# Patient Record
Sex: Male | Born: 1995 | Race: White | Hispanic: No | Marital: Single | Smoking: Current every day smoker
Health system: Southern US, Community
[De-identification: ages and names within clinical notes are randomized; demographics above are authoritative.]

---
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
## 2020-02-07 ENCOUNTER — Encounter (HOSPITAL_COMMUNITY): Payer: Self-pay

## 2020-02-07 ENCOUNTER — Other Ambulatory Visit: Payer: Self-pay

## 2020-02-07 ENCOUNTER — Ambulatory Visit (HOSPITAL_COMMUNITY)
Admission: EM | Admit: 2020-02-07 | Discharge: 2020-02-07 | Disposition: A | Discharge status: Home / Self Care | Payer: 59 | Attending: Urgent Care | Admitting: Urgent Care

## 2020-02-07 DIAGNOSIS — R21 Rash and other nonspecific skin eruption: Secondary | ICD-10-CM | POA: Diagnosis not present

## 2020-02-07 DIAGNOSIS — J029 Acute pharyngitis, unspecified: Secondary | ICD-10-CM | POA: Diagnosis not present

## 2020-02-07 LAB — POCT RAPID STREP A: Streptococcus, Group A Screen (Direct): NEGATIVE

## 2020-02-07 MED ORDER — KETOCONAZOLE 2 % EX CREA: 1.0000 "application " | TOPICAL_CREAM | Freq: Every day | CUTANEOUS | 0 refills | Status: AC

## 2020-02-07 MED ORDER — AZITHROMYCIN 250 MG PO TABS: ORAL_TABLET | ORAL | 0 refills | Status: AC

## 2020-02-07 NOTE — ED Provider Notes (Signed)
[place]   MRN: [id number] DOB: Aug 02, 1996  Subjective:   Scott Bates is a 24 y.o. male presenting for 3 day hx of acute onset worsening moderate persistent throat pain with difficulty swallowing.  The next day, patient got a Covid vaccine and ended up developing a rash over his right elbow.  He states the rash does not really bother him apart from the appearance.  Denies fever, sinus pain, sinus congestion, cough, chest pain, shortness of breath.  Denies history of allergies.  Denies taking chronic medications.  Allergies  Allergen Reactions  . Penicillins Other (See Comments)    Pt cannot recall    History reviewed. No pertinent past medical history.   History reviewed. No pertinent surgical history.  Family History  Problem Relation Age of Onset  . Healthy Mother   . Healthy Father     Social History   Tobacco Use  . Smoking status: Current Every Day Smoker  . Smokeless tobacco: Never Used  Vaping Use  . Vaping Use: Never used  Substance Use Topics  . Alcohol use: Yes  . Drug use: Yes    Types: Marijuana    ROS   Objective:   Vitals: BP 138/69   Pulse 98   Temp 97.7 F (36.5 C)   Resp 18   SpO2 100%   Physical Exam Constitutional:      General: He is not in acute distress.    Appearance: Normal appearance. He is well-developed and normal weight. He is not ill-appearing, toxic-appearing or diaphoretic.  HENT:     Head: Normocephalic and atraumatic.     Right Ear: External ear normal.     Left Ear: External ear normal.     Nose: Nose normal.     Mouth/Throat:     Pharynx: Pharyngeal swelling and oropharyngeal exudate (Bilaterally with 1+ tonsillar swelling) present.     Tonsils: Tonsillar exudate present. 1+ on the right. 1+ on the left.  Eyes:     General: No scleral icterus.       Right eye: No discharge.        Left eye: No discharge.     Extraocular Movements: Extraocular movements intact.     Pupils: Pupils are equal,  round, and reactive to light.  Cardiovascular:     Rate and Rhythm: Normal rate.  Pulmonary:     Effort: Pulmonary effort is normal.  Musculoskeletal:     Cervical back: Normal range of motion.  Skin:    General: Skin is warm and dry.     Findings: Rash (Patches of annular lesions with central clearing over extensor surface of elbow extending into proximal forearm) present.  Neurological:     Mental Status: He is alert and oriented to person, place, and time.  Psychiatric:        Mood and Affect: Mood normal.        Behavior: Behavior normal.        Thought Content: Thought content normal.        Judgment: Judgment normal.     Results for orders placed or performed during the hospital encounter of 02/07/20 (from the past 24 hour(s))  POCT rapid strep A [facility])     Status: None   Collection Time: 02/07/20  6:14 PM  Result Value Ref Range   Streptococcus, Group A Screen (Direct) NEGATIVE NEGATIVE    Assessment and Plan :   PDMP not reviewed this encounter.  1. Sore throat  2. Pharyngitis, unspecified etiology   3. Rash and nonspecific skin eruption     Given physical exam findings will cover for pharyngitis with azithromycin in light of his penicillin allergy.  Use ketoconazole to cover for fungal infection. Counseled patient on potential for adverse effects with medications prescribed/recommended today, ER and return-to-clinic precautions discussed, patient verbalized understanding.    Wallis Bamberg, [place] 02/07/20 Rickey Primus

## 2020-02-07 NOTE — ED Triage Notes (Signed)
Pt c/o sore throat x 3 days, and rash to right elbow. Pt had COVID vaccine on same arm 2 days go

## 2020-02-10 LAB — CULTURE, GROUP A STREP (THRC)

## 2020-03-02 DIAGNOSIS — Z23 Encounter for immunization: Secondary | ICD-10-CM | POA: Diagnosis not present

## 2021-08-12 ENCOUNTER — Other Ambulatory Visit (HOSPITAL_COMMUNITY): Payer: Self-pay

## 2021-08-12 MED ORDER — CARESTART COVID-19 HOME TEST VI KIT
PACK | 0 refills | Status: AC
  Filled 2021-08-12: qty 4, 4d supply, fill #0

## 2021-11-20 DIAGNOSIS — N50812 Left testicular pain: Secondary | ICD-10-CM | POA: Diagnosis not present
